# Patient Record
Sex: Female | Born: 1960 | Race: White | Hispanic: No | Smoking: Never smoker
Health system: Southern US, Community
[De-identification: ages and names within clinical notes are randomized; demographics above are authoritative.]

## PROBLEM LIST (undated history)

## (undated) DIAGNOSIS — E119 Type 2 diabetes mellitus without complications: Secondary | ICD-10-CM

## (undated) DIAGNOSIS — I10 Essential (primary) hypertension: Secondary | ICD-10-CM

## (undated) DIAGNOSIS — G629 Polyneuropathy, unspecified: Secondary | ICD-10-CM

## (undated) HISTORY — PX: ECTOPIC PREGNANCY SURGERY: SHX613

## (undated) HISTORY — PX: CHOLECYSTECTOMY: SHX55

## (undated) HISTORY — PX: DILATION AND CURETTAGE OF UTERUS: SHX78

## (undated) HISTORY — PX: TUBAL LIGATION: SHX77

---

## 2013-09-13 ENCOUNTER — Emergency Department (HOSPITAL_COMMUNITY)
Admission: EM | Admit: 2013-09-13 | Discharge: 2013-09-13 | Disposition: A | Discharge status: Home / Self Care | Payer: No Typology Code available for payment source | Attending: Emergency Medicine | Admitting: Emergency Medicine

## 2013-09-13 ENCOUNTER — Emergency Department (HOSPITAL_COMMUNITY): Payer: No Typology Code available for payment source

## 2013-09-13 ENCOUNTER — Encounter (HOSPITAL_COMMUNITY): Payer: Self-pay | Admitting: Emergency Medicine

## 2013-09-13 DIAGNOSIS — S1093XA Contusion of unspecified part of neck, initial encounter: Principal | ICD-10-CM

## 2013-09-13 DIAGNOSIS — Y9389 Activity, other specified: Secondary | ICD-10-CM | POA: Insufficient documentation

## 2013-09-13 DIAGNOSIS — Z79899 Other long term (current) drug therapy: Secondary | ICD-10-CM | POA: Insufficient documentation

## 2013-09-13 DIAGNOSIS — S0003XA Contusion of scalp, initial encounter: Secondary | ICD-10-CM | POA: Insufficient documentation

## 2013-09-13 DIAGNOSIS — S60229A Contusion of unspecified hand, initial encounter: Secondary | ICD-10-CM | POA: Insufficient documentation

## 2013-09-13 DIAGNOSIS — S60221A Contusion of right hand, initial encounter: Secondary | ICD-10-CM

## 2013-09-13 DIAGNOSIS — Y92410 Unspecified street and highway as the place of occurrence of the external cause: Secondary | ICD-10-CM | POA: Insufficient documentation

## 2013-09-13 DIAGNOSIS — S0083XA Contusion of other part of head, initial encounter: Principal | ICD-10-CM

## 2013-09-13 DIAGNOSIS — E119 Type 2 diabetes mellitus without complications: Secondary | ICD-10-CM | POA: Insufficient documentation

## 2013-09-13 DIAGNOSIS — G589 Mononeuropathy, unspecified: Secondary | ICD-10-CM | POA: Insufficient documentation

## 2013-09-13 HISTORY — DX: Polyneuropathy, unspecified: G62.9

## 2013-09-13 HISTORY — DX: Type 2 diabetes mellitus without complications: E11.9

## 2013-09-13 MED ORDER — HYDROCODONE-ACETAMINOPHEN 5-325 MG PO TABS: ORAL_TABLET | ORAL | Status: DC

## 2013-09-13 MED ORDER — METHOCARBAMOL 500 MG PO TABS: 1000.0000 mg | ORAL_TABLET | Freq: Four times a day (QID) | ORAL | Status: DC | PRN

## 2013-09-13 MED ORDER — HYDROCODONE-ACETAMINOPHEN 5-325 MG PO TABS
1.0000 | ORAL_TABLET | Freq: Once | ORAL | Status: AC
  Administered 2013-09-13: 1 via ORAL
  Filled 2013-09-13: qty 1
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
## 2013-09-13 NOTE — Discharge Instructions (Signed)
°Emergency Department Resource Guide °1) Find a Doctor and Pay Out of Pocket °Although you won't have to find out who is covered by your insurance plan, it is a good idea to ask around and get recommendations. You will then need to call the office and see if the doctor you have chosen will accept you as a new patient and what types of options they offer for patients who are self-pay. Some doctors offer discounts or will set up payment plans for their patients who do not have insurance, but you will need to ask so you aren't surprised when you get to your appointment. ° °2) Contact Your Local Health Department °Not all health departments have doctors that can see patients for sick visits, but many do, so it is worth a call to see if yours does. If you don't know where your local health department is, you can check in your phone book. The CDC also has a tool to help you locate your state's health department, and many state websites also have listings of all of their local health departments. ° °3) Find a Walk-in Clinic °If your illness is not likely to be very severe or complicated, you may want to try a walk in clinic. These are popping up all over the country in pharmacies, drugstores, and shopping centers. They're usually staffed by nurse practitioners or physician assistants that have been trained to treat common illnesses and complaints. They're usually fairly quick and inexpensive. However, if you have serious medical issues or chronic medical problems, these are probably not your best option. ° °No Primary Care Doctor: °- Call [facility] at  832-8000 - they can help you locate a primary care doctor that  accepts your insurance, provides certain services, etc. °- Physician Referral Service- 1-800-533-3463 ° °Chronic Pain Problems: °Organization         Address  Phone   Notes  °[facility]  (336) [phone] Patients need to be referred by their primary care doctor.  ° °Medication  Assistance: °Organization         Address  Phone   Notes  °[place] [facility] [address] °[place], [place] [address] (336) 641-8030 --Must be a resident of [place] °-- Must have NO insurance coverage whatsoever (no Medicaid/ Medicare, etc.) °-- The pt. MUST have a primary care doctor that directs their care regularly and follows them in the community °  °[facility]  (866) 331-1348   °[employer]  (888) 892-1162   ° °Agencies that provide inexpensive medical care: °Organization         Address  Phone   Notes  °[facility]  (336) 832-8035   °[facility]    (336) 832-7272   °[facility] [address] °[place], [place] [address] (336) 832-4777   °[facility] [address], °[place] (336) 271-4999   °[facility]    (336) 373-0678   °[facility]    (336) 272-1050   °[facility] ° [address], [place] Phone:  (336) 832-4444, Fax:  (336) 832-4440 Hours of Operation:  9 am - 6 pm, M-F.  Also accepts Medicaid/Medicare and self-pay.  °[facility] ° [address], [place] Phone: (336) 832-3150, Fax: (336) 832-3151. Hours of Operation:  8:30 am - 5:30 pm, M-F.  Also accepts Medicaid and self-pay.  °[facility] [place] [address], [place] Phone: (336) 878-6027   °[facility] [address], [place], [place] (336)723-1848, Ext. 123 Mondays & Thursdays: 7-9 AM.  First 15 patients are seen on a first come, first serve basis. °  ° °Medicaid-accepting [place] Providers: ° °Organization         Address  Phone   Notes  °[facility] [address], [place] (336) 641-2100 Also accepts self-pay patients.  °[facility] [address], [place] ° (336) 856-9996   °[facility] [address], [place]  (336) 288-8857   °[facility] [address] [place] [address], [place] (336) 299-7000   °Veita Bland [address], [place]  ° (336) 373-1557 Only accepts [place] Access Medicaid patients after they have their name applied to their card.  ° °Self-Pay (no insurance) in [place]: ° °Organization         Address  Phone   Notes  °Sickle Cell Patients, [facility] [address], [place] (336) 832-1970   °[facility] [address], [place] (336) 832-4400   ° [facility] ° [address], [place] (336) 992-4800   °[facility]/Dr. Osei-Bonsu ° [address] [place] [address], [place] or [address], [place] (336) 841-8500 Phone number for both [place] and [place] locations is the same.  °[facility] [address], [place] (336) 299-0000   °[facility] [address] [place] [address], [place] or [address] (336) 852-7530 °(336) 878-2260   °[facility] [address], [place] (336) 350-1642, phone; (336) 294-5005, fax Sees patients 1st and 3rd Saturday of every month.  Must not qualify for public or private insurance (i.e. Medicaid, Medicare, [place] Health Choice, Veterans' Benefits) • Household income should be no more than 200% of the poverty level •The clinic cannot treat you if you are pregnant or think you are pregnant • Sexually transmitted diseases are not treated at the clinic.  ° ° °Dental Care: °Organization         Address  Phone  Notes  °[place] [facility] [address], [place] (336) 641-6152 Accepts children up to age 21 who are enrolled in Medicaid or [place] Health Choice; pregnant women with a Medicaid card; and children who have applied for Medicaid or [place] Health Choice, but were declined, whose parents can pay a reduced fee at time of service.  °[place]  [facility] [place]  [address], [place] (336) 641-7733 Accepts children up to age 21 who are enrolled in Medicaid or [place] Health Choice; pregnant women with a Medicaid card; and children who have applied for Medicaid or [place] Health Choice, but were declined, whose parents can pay a reduced fee at time of service.  °[facility] ° [address], [place] (336) 641-4533 Patients are seen by appointment only. Walk-ins are not accepted. [facility] will see patients 18 years of age and older. °Monday - Tuesday (8am-5pm) °Most Wednesdays (8:30-5pm) °$30 per visit, cash only  °[facility] ° [address], [place] (336) 641-4533 Patients are seen by appointment only. Walk-ins are not accepted. [facility] will see patients 18 years of age and older. °One   Wednesday Evening (Monthly: Volunteer Based).  $30 per visit, cash only  °[facility]  (919) 537-3737 for adults; Children under age 4, call Graduate Pediatric Dentistry at ([phone]) [phone]. Children aged 4-14, please call (919) [phone] to request a pediatric application. ° Dental services are provided in all areas of dental care including fillings, crowns and bridges, complete and partial dentures, implants, gum treatment, root canals, and extractions. Preventive care is also provided. Treatment is provided to both adults and children. °Patients are selected via a lottery and there is often a waiting list. °  °[facility] [address], °[place] ° (336) 763-8833 [email or web address] °  °[facility] [address], [place], [place] (336)723-1848, Ext. 123 Second and Fourth Thursday of each month, opens at 6:30 AM; Clinic ends at 9 AM.  Patients are seen on a first-come first-served basis, and a limited number are seen during each clinic.  ° °[facility] ° [address], [place], [place] (336) 723-7904    Eligibility Requirements °You must have lived in [place], [place], or [place] counties for at least the last three months. °  You cannot be eligible for state or federal sponsored healthcare insurance, including Veterans Administration, Medicaid, or Medicare. °  You generally cannot be eligible for healthcare insurance through your employer.  °  How to apply: °Eligibility screenings are held every Tuesday and Wednesday afternoon from 1:00 pm until 4:00 pm. You do not need an appointment for the interview!  °[facility] [address], [place], [place] 336-631-2330   °[facility]  336-342-8273   °[facility]  336-703-3100   °[facility]  336-570-6415   ° °Behavioral Health Resources in the Community: °Intensive Outpatient Programs °Organization         Address  Phone  Notes  °[facility] [address], [place], [place] 336-878-6098   °[facility] [address], [place], [place] 336-832-9800   °[facility] [address], [place], [place] ° 336-882-2125   °[facility] [address],  °[place], [place] 1-800-853-5163 or 336-641-4981   °Substance Abuse Resources °Organization         Address  Phone  Notes  °[facility]  336-882-2125   °[facility]  336-784-9470   °[facility]  336-285-9073   °[facility]  336-845-3988   °Residential & Outpatient Substance Abuse Program  1-800-659-3381   °Psychological Services °Organization         Address  Phone  Notes  °[facility]  336- 832-9600   °[facility]  336- 378-7881   °[facility] [address], [place] 1-800-853-5163 or 336-641-4981   ° °Mobile Crisis Teams °Organization         Address  Phone  Notes  °[facility], Mobile Crisis Care Unit  1-877-626-1772   °Assertive °[facility] ° [address]  [place], [place] 336-834-9664   °Sharon DeEsch [address] °[place]  336-554-5454   ° °Self-Help/Support Groups °Organization         Address  Phone             Notes  °[facility] - variety of support groups  336- 373-1402 Call for more information  °Narcotics Anonymous (NA), [facility] [address], °[place]   2 meetings at this location  ° °  Residential Treatment Programs °Organization         Address  Phone  Notes  °[facility] [address],    °[place]  1-866-801-8205   °[facility] ° [address], [place], [place] 704-293-8524   °[facility] [address], [place] 336-845-3988 Admissions: 8am-3pm M-F  °[facility] [address]    °[place], [place] 336-841-1104   °[facility] [address], [place], [place] 336-379-7146   °[facility] [address]  °[place], [place] 336-285-9073   °[facility] - Intensive Outpatient [address], [place], [place] 336-852-3033   °[facility] [address]  °[place], [place] 1-877-615-2722 or 336-784-9470   °[facility] [address] [place], [place] 336-227-7417 Accepts Medicaid  °[facility] [address]  °[place] 1-800-659-3381 Substance Abuse/Addiction Treatment  ° °[place] Behavioral Health Resources °Organization         Address  Phone  Notes  °[facility]  (888) 581-9988   °Julie Brannon, PhD [address], [place]   (336) 349-5553 or (336) 951-0000   ° Behavioral   [address] °[place], [place] (336) 349-4454   °[facility] [address], [place], [place] (336) 342-8316 Insurance/Medicaid/sponsorship through [facility]  °[facility] [address] [place] [address]                                    [place], [place] (336) 342-8316 Therapy/tele-psych/case    °[facility] [address]  ° [place], [place] (336) 349-2233    °Dr. Arfeen  (336) 349-4544   °[facility] [place]  [employer] [place] [facility] 1) [address], [place] °2) [address], [place] °3)  [address], [place] (336) 349-3220 °(336) 342-7768 ° °(336) 342-8140   °[place] [facility] (336) 342-1394 or (336) 342-3537 (After Hours)    ° °Take the prescriptions as directed.  Apply moist heat or ice to the area(s) of discomfort, for 15 minutes at a time, several times per day for the next few days.  Do not fall asleep on a heating or ice pack.  Call your regular medical doctor tomorrow to schedule a follow up appointment in the next 2 days.  Return to the Emergency Department immediately if worsening. ° °
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
## 2022-08-08 ENCOUNTER — Other Ambulatory Visit: Payer: Self-pay

## 2022-08-08 ENCOUNTER — Ambulatory Visit
Admission: EM | Admit: 2022-08-08 | Discharge: 2022-08-08 | Disposition: A | Discharge status: Home / Self Care | Payer: Medicaid Other | Attending: Physician Assistant | Admitting: Physician Assistant

## 2022-08-08 DIAGNOSIS — R8290 Unspecified abnormal findings in urine: Secondary | ICD-10-CM | POA: Diagnosis not present

## 2022-08-08 DIAGNOSIS — N771 Vaginitis, vulvitis and vulvovaginitis in diseases classified elsewhere: Secondary | ICD-10-CM | POA: Diagnosis not present

## 2022-08-08 DIAGNOSIS — N898 Other specified noninflammatory disorders of vagina: Secondary | ICD-10-CM

## 2022-08-08 DIAGNOSIS — N760 Acute vaginitis: Secondary | ICD-10-CM | POA: Insufficient documentation

## 2022-08-08 LAB — POCT URINALYSIS DIP (MANUAL ENTRY)
Bilirubin, UA: NEGATIVE
Glucose, UA: 500 mg/dL — AB
Ketones, POC UA: NEGATIVE mg/dL
Leukocytes, UA: NEGATIVE
Nitrite, UA: POSITIVE — AB
Protein Ur, POC: NEGATIVE mg/dL
Spec Grav, UA: 1.01 (ref 1.010–1.025)
Urobilinogen, UA: 0.2 E.U./dL
pH, UA: 5.5 (ref 5.0–8.0)

## 2022-08-08 MED ORDER — FLUCONAZOLE 150 MG PO TABS: 150.0000 mg | ORAL_TABLET | ORAL | 0 refills | Status: DC | PRN

## 2022-08-08 MED ORDER — CLOTRIMAZOLE 1 % VA CREA: 1.0000 | TOPICAL_CREAM | Freq: Every day | VAGINAL | 0 refills | Status: DC

## 2022-08-08 NOTE — ED Provider Notes (Signed)
[facility]    CSN: [id number] Arrival date & time: 08/08/22  1224      History   Chief Complaint Chief Complaint  Patient presents with   vaginal/perineal pain    HPI Jill Logan is a 61 y.o. female.   Patient sent today with a several week history of vaginal irritation with bowel failure swelling and itching.  She describes this as an intense itching/burning sensation that is worse when she is exposed to warmth.  She has tried Desitin as well as taking 50 mg of Benadryl at night with improvement but not resolution of symptoms.  She does have a history of diabetes but reports that her blood sugars are adequately controlled.  She does not take an SGLT2 inhibitor; takes glargine and metformin.  She denies any history of recurrent yeast infections.  She reports that she does have ongoing polyuria, polydipsia but this is at baseline and denies any increase in urinary symptoms including frequency, urgency, dysuria.  Denies any changes to personal hygiene products including soaps or detergents.  Reports that she is unable to sleep as a result of the symptoms.    Past Medical History:  Diagnosis Date   Diabetes mellitus without complication (HCC)    Neuropathy     There are no problems to display for this patient.   Past Surgical History:  Procedure Laterality Date   CHOLECYSTECTOMY     DILATION AND CURETTAGE OF UTERUS     ECTOPIC PREGNANCY SURGERY     TUBAL LIGATION      OB History   No obstetric history on file.      Home Medications    Prior to Admission medications   Medication Sig Start Date End Date Taking? Authorizing Provider  clotrimazole (GYNE-LOTRIMIN) 1 % vaginal cream Place 1 Applicatorful vaginally at bedtime. 08/08/22  Yes Ayat Drenning K, PA-C  fluconazole (DIFLUCAN) 150 MG tablet Take 1 tablet (150 mg total) by mouth every 3 (three) days as needed for up to 3 doses. 08/08/22  Yes Eastyn Skalla K, PA-C  Zinc Oxide (DESITIN CREAMY EX) Apply  topically.   Yes [provider]    Family History Family History  Problem Relation Age of Onset   Cancer Mother    Heart disease Father     Social History Social History   Tobacco Use   Smoking status: Never   Smokeless tobacco: Never  Vaping Use   Vaping Use: Never used  Substance Use Topics   Alcohol use: No   Drug use: No     Allergies   Dextromethorphan and Tramadol   Review of Systems Review of Systems  Constitutional:  Positive for activity change. Negative for appetite change, fatigue and fever.  Gastrointestinal:  Negative for abdominal pain, diarrhea, nausea and vomiting.  Genitourinary:  Positive for vaginal pain. Negative for dysuria, frequency, genital sores, urgency, vaginal bleeding and vaginal discharge.     Physical Exam Triage Vital Signs ED Triage Vitals  Enc Vitals Group     BP 08/08/22 1258 (!) 163/86     Pulse Rate 08/08/22 1258 81     Resp 08/08/22 1258 20     Temp 08/08/22 1258 98.8 F (37.1 C)     Temp Source 08/08/22 1258 Oral     SpO2 08/08/22 1258 96 %     Weight 08/08/22 1254 280 lb (127 kg)     Height 08/08/22 1254 5\' 4"  (1.626 m)     Head Circumference --  Peak Flow --      Pain Score 08/08/22 1254 10     Pain Loc --      Pain Edu? --      Excl. in [place]? --    No data found.  Updated Vital Signs BP (!) 163/86 (BP Location: Right Arm)   Pulse 81   Temp 98.8 F (37.1 C) (Oral)   Resp 20   Ht 5\' 4"  (1.626 m)   Wt 280 lb (127 kg)   SpO2 96%   BMI 48.06 kg/m   Visual Acuity Right Eye Distance:   Left Eye Distance:   Bilateral Distance:    Right Eye Near:   Left Eye Near:    Bilateral Near:     Physical Exam Vitals reviewed.  Constitutional:      General: She is awake. She is not in acute distress.    Appearance: Normal appearance. She is well-developed. She is not ill-appearing.     Comments: Very pleasant female appears stated age in no acute distress sitting comfortably on exam room table   HENT:     Head: Normocephalic and atraumatic.  Cardiovascular:     Rate and Rhythm: Normal rate and regular rhythm.     Heart sounds: Normal heart sounds, S1 normal and S2 normal. No murmur heard. Pulmonary:     Effort: Pulmonary effort is normal.     Breath sounds: Normal breath sounds. No wheezing, rhonchi or rales.     Comments: Clear to auscultation bilaterally Abdominal:     General: Bowel sounds are normal.     Palpations: Abdomen is soft.     Tenderness: There is no abdominal tenderness. There is no right CVA tenderness, left CVA tenderness, guarding or rebound.     Comments: Benign abdominal exam  Genitourinary:    Labia:        Right: Rash present. No tenderness.        Left: Rash present. No tenderness.      Comments: Swelling and erythema noted labia majora and minora with thick white discharge noted at introitus. Psychiatric:        Behavior: Behavior is cooperative.      UC Treatments / Results  Labs (all labs ordered are listed, but only abnormal results are displayed) Labs Reviewed  POCT URINALYSIS DIP (MANUAL ENTRY) - Abnormal; Notable for the following components:      Result Value   Color, UA light yellow (*)    Glucose, UA =500 (*)    Blood, UA trace-intact (*)    Nitrite, UA Positive (*)    All other components within normal limits  URINE CULTURE  CERVICOVAGINAL ANCILLARY ONLY    EKG   Radiology No results found.  Procedures Procedures (including critical care time)  Medications Ordered in UC Medications - No data to display  Initial Impression / Assessment and Plan / UC Course  I have reviewed the triage vital signs and the nursing notes.  Pertinent labs & imaging results that were available during my care of the patient were reviewed by me and considered in my medical decision making (see chart for details).     Patient is well-appearing, afebrile, nontoxic, nontachycardic.  Urine was obtained during triage and showed positive  nitrates, however, I am much more concerned for yeast infection given clinical presentation and exam findings.  Due to concern that additional antibiotics could worsen yeast infection and patient is not having any symptoms of UTI will send this for culture but  defer antibiotics for the time being.  If she does require antibiotics for UTI she will need additional Diflucan to manage yeast.  Clinical presentation is consistent with yeast vaginitis.  Will start Diflucan every 72 hours for up to 3 doses.  She was also prescribed clotrimazole topically.  Discussed that she is to rest and drink plenty of fluid.  Swab was collected today and if we need to adjust her treatment we will contact her.  She is to use hypoallergenic soaps and detergents and wear loosefitting cotton underwear.  Recommended follow-up with her primary care/OB/GYN.  Discussed that if she has any worsening or changing symptoms she needs to be seen immediately.  Final Clinical Impressions(s) / UC Diagnoses   Final diagnoses:  Vaginitis and vulvovaginitis  Vaginal irritation  Abnormal urinalysis     Discharge Instructions      Take fluconazole 1 dose every 72 hours starting today for up to 3 doses.  Use clotrimazole topically twice daily.  Use hypoallergenic soaps and detergents.  Wear loosefitting cotton underwear.  I do recommend follow-up with OB/GYN; call them to schedule an appointment.  We will contact you if we need to arrange any treatment for a urinary tract infection.  Please continue pushing fluids.  If you have any worsening or changing symptoms including fever, nausea/vomiting, genital lesions, increasing pain you should be seen immediately.     ED Prescriptions     Medication Sig Dispense Auth. Provider   fluconazole (DIFLUCAN) 150 MG tablet Take 1 tablet (150 mg total) by mouth every 3 (three) days as needed for up to 3 doses. 3 tablet Srihari Shellhammer K, PA-C   clotrimazole (GYNE-LOTRIMIN) 1 % vaginal cream Place 1  Applicatorful vaginally at bedtime. 45 g Brooke Steinhilber K, PA-C      PDMP not reviewed this encounter.   Jeani Hawking, PA-C 08/08/22 1333

## 2022-08-08 NOTE — Discharge Instructions (Signed)
Take fluconazole 1 dose every 72 hours starting today for up to 3 doses.  Use clotrimazole topically twice daily.  Use hypoallergenic soaps and detergents.  Wear loosefitting cotton underwear.  I do recommend follow-up with OB/GYN; call them to schedule an appointment.  We will contact you if we need to arrange any treatment for a urinary tract infection.  Please continue pushing fluids.  If you have any worsening or changing symptoms including fever, nausea/vomiting, genital lesions, increasing pain you should be seen immediately.

## 2022-08-08 NOTE — ED Triage Notes (Signed)
Pt presents to Urgent Care with c/o burning pain, itching and swelling to vaginal/perineal area x several weeks. Has recently been applying Desitin cream and taking benadryl.

## 2022-08-10 LAB — CERVICOVAGINAL ANCILLARY ONLY
Bacterial Vaginitis (gardnerella): NEGATIVE
Candida Glabrata: POSITIVE — AB
Candida Vaginitis: POSITIVE — AB
Chlamydia: NEGATIVE
Comment: NEGATIVE
Comment: NEGATIVE
Comment: NEGATIVE
Comment: NEGATIVE
Comment: NEGATIVE
Comment: NORMAL
Neisseria Gonorrhea: NEGATIVE
Trichomonas: NEGATIVE

## 2022-08-11 ENCOUNTER — Telehealth (HOSPITAL_COMMUNITY): Payer: Self-pay | Admitting: Emergency Medicine

## 2022-08-11 LAB — URINE CULTURE: Culture: >=100000 — AB

## 2022-08-11 MED ORDER — NITROFURANTOIN MONOHYD MACRO 100 MG PO CAPS: 100.0000 mg | ORAL_CAPSULE | Freq: Two times a day (BID) | ORAL | 0 refills | Status: DC

## 2022-09-01 ENCOUNTER — Encounter: Payer: Self-pay | Admitting: Emergency Medicine

## 2022-09-01 ENCOUNTER — Ambulatory Visit
Admission: EM | Admit: 2022-09-01 | Discharge: 2022-09-01 | Disposition: A | Discharge status: Home / Self Care | Payer: Medicaid Other | Attending: Family Medicine | Admitting: Family Medicine

## 2022-09-01 DIAGNOSIS — Z91148 Patient's other noncompliance with medication regimen for other reason: Secondary | ICD-10-CM | POA: Diagnosis not present

## 2022-09-01 DIAGNOSIS — E1165 Type 2 diabetes mellitus with hyperglycemia: Secondary | ICD-10-CM

## 2022-09-01 DIAGNOSIS — I10 Essential (primary) hypertension: Secondary | ICD-10-CM

## 2022-09-01 HISTORY — DX: Essential (primary) hypertension: I10

## 2022-09-01 LAB — POCT FASTING CBG KUC MANUAL ENTRY: POCT Glucose (KUC): 395 mg/dL — AB (ref 70–99)

## 2022-09-01 NOTE — ED Triage Notes (Addendum)
High BP per pt  Took 800mg  of Ibuprofen at noon Pt states her fingers get swollen & face is flushed when her BP is high Pt does not have a cuff to check at home  Has been assigned a new doctor  thru medicaid  Can not get an appointment until the end of February  Recently went back to work, BP was elevated at work (152/82) and needs blood pressure parameters to return  Pt was on lisinopril in the past

## 2022-09-01 NOTE — ED Notes (Signed)
Dr Meda Coffee verbally updated by this RN of eleated CBG results- pt stated she drank a [employer] in the parking lot pta

## 2022-09-01 NOTE — ED Provider Notes (Signed)
[facility]    CSN: [id number] Arrival date & time: 09/01/22  1458      History   Chief Complaint Chief Complaint  Patient presents with   Hypertension    HPI Jill Logan is a 62 y.o. female.   HPI  This is a pleasant 62 year old woman.  Multiple medical problems including diabetes, hypertension, and neuropathy.  She states that she has not been to her primary care doctor since the last 2022.  She has had multiple phone calls and refills to her old doctor, she is gone to the urgent care.  She is not currently taking any medication.  She works for a company that periodically checks her health.  She had an elevated blood pressure last September and went to an urgent care to get a form filled and to continue working.  Her blood pressure was checked again today, is elevated, and she comes here hoping I will fill out the similar form.  She states that when her blood pressure is very high she sometimes has dizzy feeling, swelling in her face, tingling in her fingers.  She is not following a diabetic diet.  She is not watching her salt.  She is not getting regular exercise due to significant osteoarthritis in her knees.  States that she was laid off last September.  Went back to work this week.  On a regular blood pressure check it was elevated so she was advised to get medical care.  She consulted her Medicaid card and it has a provider's name on it.  That provider cannot see her for a month.  Currently feels okay.  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   Past Surgical History:  Procedure Laterality Date   CHOLECYSTECTOMY     DILATION AND CURETTAGE OF UTERUS     ECTOPIC PREGNANCY SURGERY     TUBAL LIGATION       OB History   No obstetric history on file.      Home Medications    Prior to Admission medications   Not on File    Family History Family History  Problem Relation Age of Onset   Cancer Mother    Heart disease Father     Social History Social History   Tobacco Use   Smoking status: Never   Smokeless tobacco: Never  Vaping Use   Vaping Use: Never used  Substance Use Topics   Alcohol use: No   Drug use: No     Allergies   Dextromethorphan and Tramadol   Review of Systems Review of Systems See HPI  Physical Exam Triage Vital Signs ED Triage Vitals  Enc Vitals Group     BP 09/01/22 1509 (!) 175/101     Pulse Rate 09/01/22 1509 77     Resp 09/01/22 1509 18     Temp 09/01/22 1509 98.9 F (37.2 C)     Temp Source 09/01/22 1509 Oral     SpO2 09/01/22 1509 97 %     Weight 09/01/22 1513 279 lb 15.8 oz (127 kg)     Height 09/01/22 1513 5\' 4"  (1.626 m)     Head Circumference --  Peak Flow --      Pain Score 09/01/22 1513 0     Pain Loc --      Pain Edu? --      Excl. in [place]? --    No data found.  Updated Vital Signs BP (!) 184/106   Pulse 72   Temp 98.9 F (37.2 C) (Oral)   Resp 18   Ht 5\' 4"  (1.626 m)   Wt 127 kg   SpO2 95%   BMI 48.06 kg/m     Physical Exam Constitutional:      General: She is not in acute distress.    Appearance: She is well-developed. She is obese.  HENT:     Head: Normocephalic and atraumatic.  Eyes:     Conjunctiva/sclera: Conjunctivae normal.     Pupils: Pupils are equal, round, and reactive to light.  Cardiovascular:     Rate and Rhythm: Normal rate.  Pulmonary:     Effort: Pulmonary effort is normal. No respiratory distress.  Musculoskeletal:        General: Normal range of motion.     Cervical back: Normal range of motion.  Skin:    General: Skin is warm and dry.  Neurological:     Mental Status: She is alert.     Gait: Gait abnormal.     Comments: Uses cane      UC Treatments / Results   Labs (all labs ordered are listed, but only abnormal results are displayed) Labs Reviewed  POCT FASTING CBG [facility] MANUAL ENTRY - Abnormal; Notable for the following components:      Result Value   POCT Glucose ([facility]) 395 (*)    All other components within normal limits    EKG   Radiology No results found.  Procedures Procedures (including critical care time)  Medications Ordered in UC Medications - No data to display  Initial Impression / Assessment and Plan / UC Course  I have reviewed the triage vital signs and the nursing notes.  Pertinent labs & imaging results that were available during my care of the patient were reviewed by me and considered in my medical decision making (see chart for details).     I explained to the patient that I did not feel it was safe to sign a form and send her back to work.  I do not feel it is safe just to start her back on her lisinopril without doing additional lab work.  I told her with uncontrolled hypertension for many months and uncontrolled diabetes I have no idea what her kidney function may be.  I cannot safely treat her under the circumstances.  I have sent her to the emergency room for higher level of care.  Final Clinical Impressions(s) / UC Diagnoses   Final diagnoses:  Noncompliance with medication regimen  Uncontrolled type 2 diabetes mellitus with hyperglycemia ([place])  Uncontrolled stage 2 hypertension     Discharge Instructions      Blood sugar 395 Blood pressure initially 175/101 You MUST STOP drinking sugary drinks and eating sweets Avoid salt  Call your new doctor office and try to be seen sooner, otherwise go to the ER   ED Prescriptions   None    PDMP not reviewed this encounter.   Raylene Everts, MD 09/01/22 [phone]

## 2022-09-01 NOTE — Discharge Instructions (Addendum)
Blood sugar 395 Blood pressure initially 175/101 You MUST STOP drinking sugary drinks and eating sweets Avoid salt  Call your new doctor office and try to be seen sooner, otherwise go to the ER

## 2022-09-01 NOTE — ED Notes (Addendum)
Patient is being discharged from the Urgent Care and sent to the Emergency Department via POV . Per Dr Meda Coffee, patient is in need of higher level of care due to HTN & elevated CBG. Patient is aware and verbalizes understanding of plan of care.  Vitals:   09/01/22 1509 09/01/22 1528  BP: (!) 175/101 (!) 184/106  Pulse: 77 72  Resp: 18 18  Temp: 98.9 F (37.2 C)   SpO2: 97% 95%  RN attempted to call report - unable to speak w/ triage RN at [facility]
